# Patient Record
Sex: Male | Born: 1960 | Race: Asian | Hispanic: No | Marital: Married
Health system: Southern US, Community
[De-identification: ages and names within clinical notes are randomized; demographics above are authoritative.]

---

## 2015-03-22 ENCOUNTER — Other Ambulatory Visit: Payer: Self-pay | Admitting: Family Medicine

## 2015-03-22 ENCOUNTER — Ambulatory Visit
Admission: RE | Admit: 2015-03-22 | Discharge: 2015-03-22 | Disposition: A | Discharge status: Home / Self Care | Payer: BC Managed Care – PPO | Source: Ambulatory Visit | Attending: Family Medicine | Admitting: Family Medicine

## 2015-03-22 DIAGNOSIS — R05 Cough: Secondary | ICD-10-CM

## 2015-03-22 DIAGNOSIS — R059 Cough, unspecified: Secondary | ICD-10-CM

## 2016-05-30 IMAGING — CR DG CHEST 2V
2 series · 2 of 2 positions shown · non-contrast
Comparison: None.

CLINICAL DATA: Cough with persistent burning sensation in lungs

EXAM:
CHEST  2 VIEW

[w chest pa]
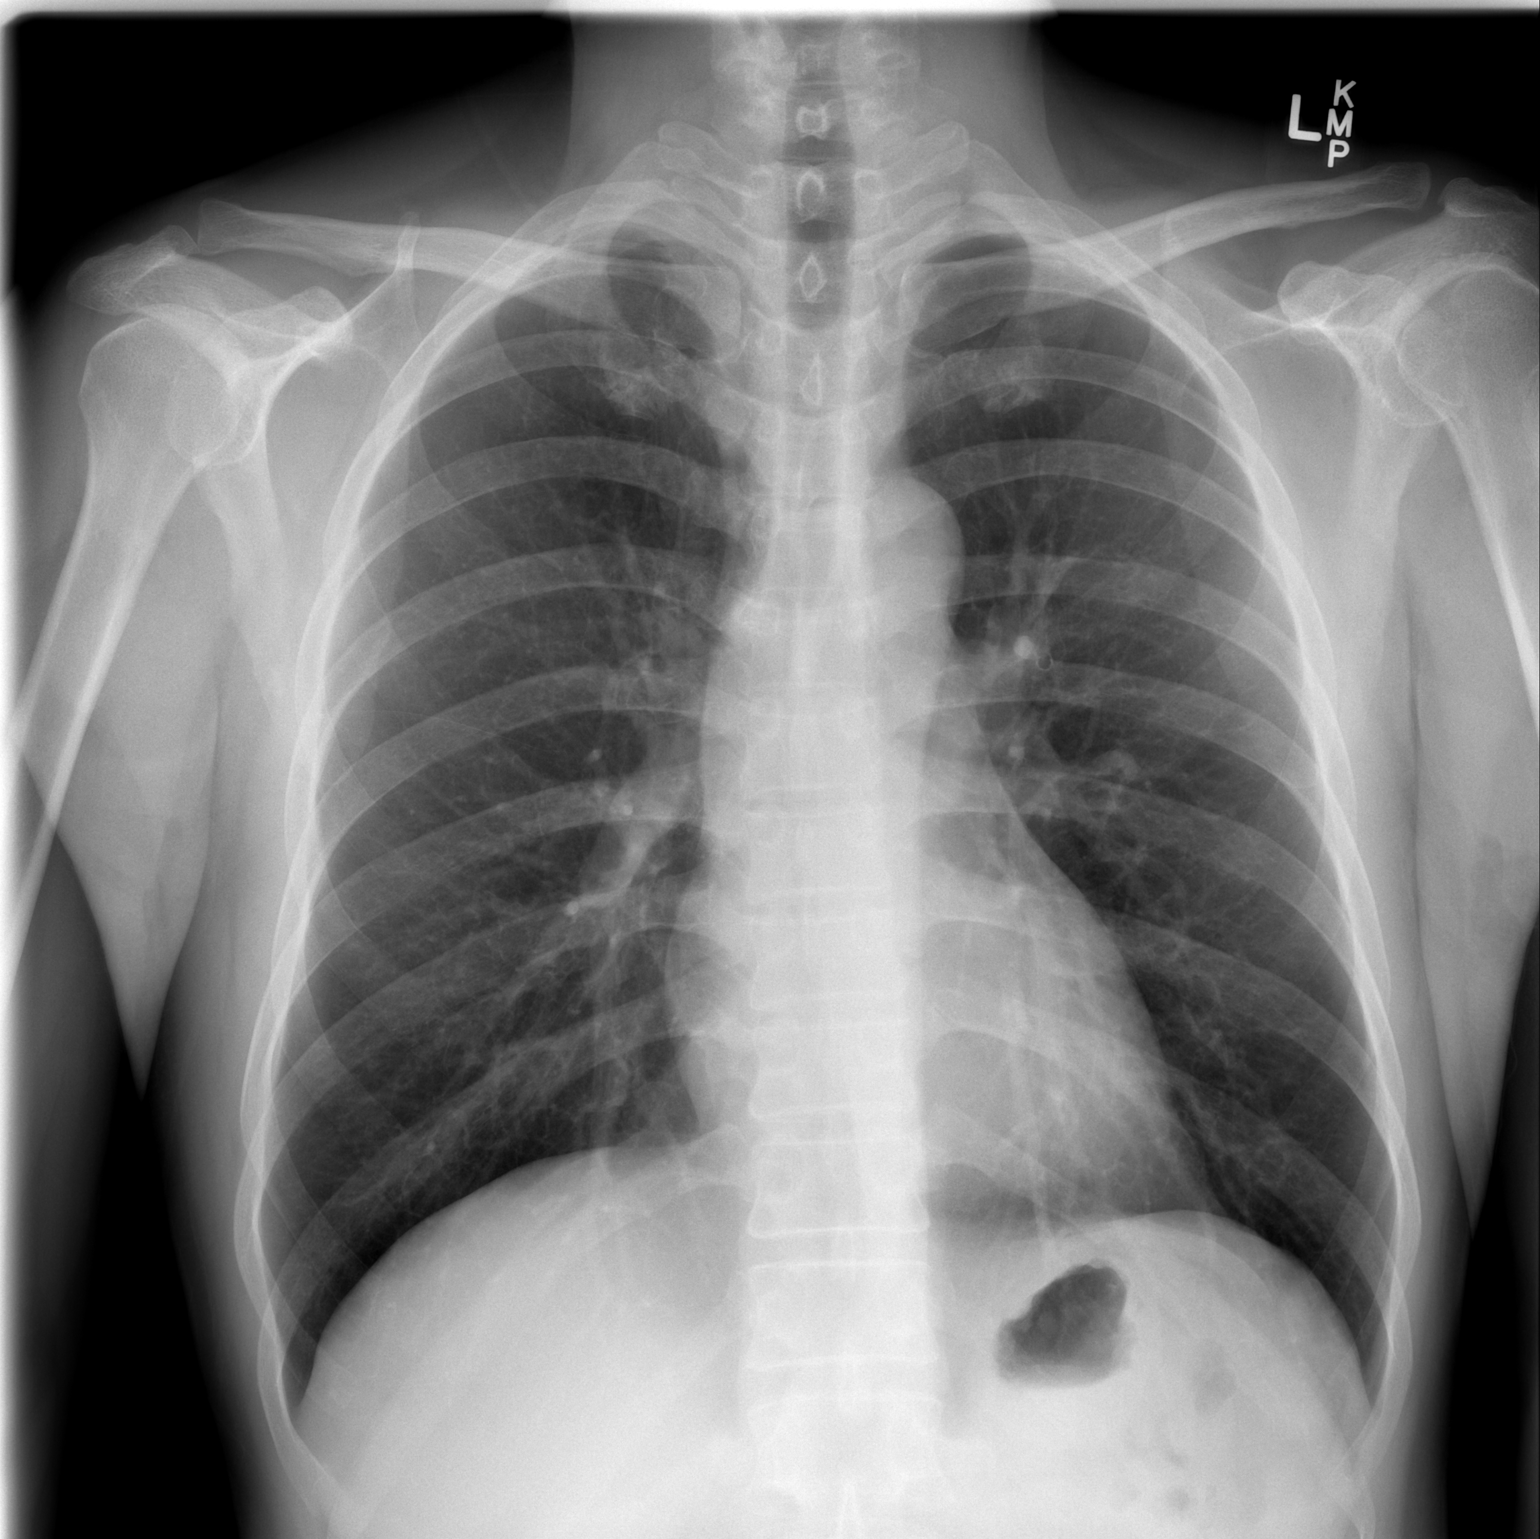

[w chest lat]
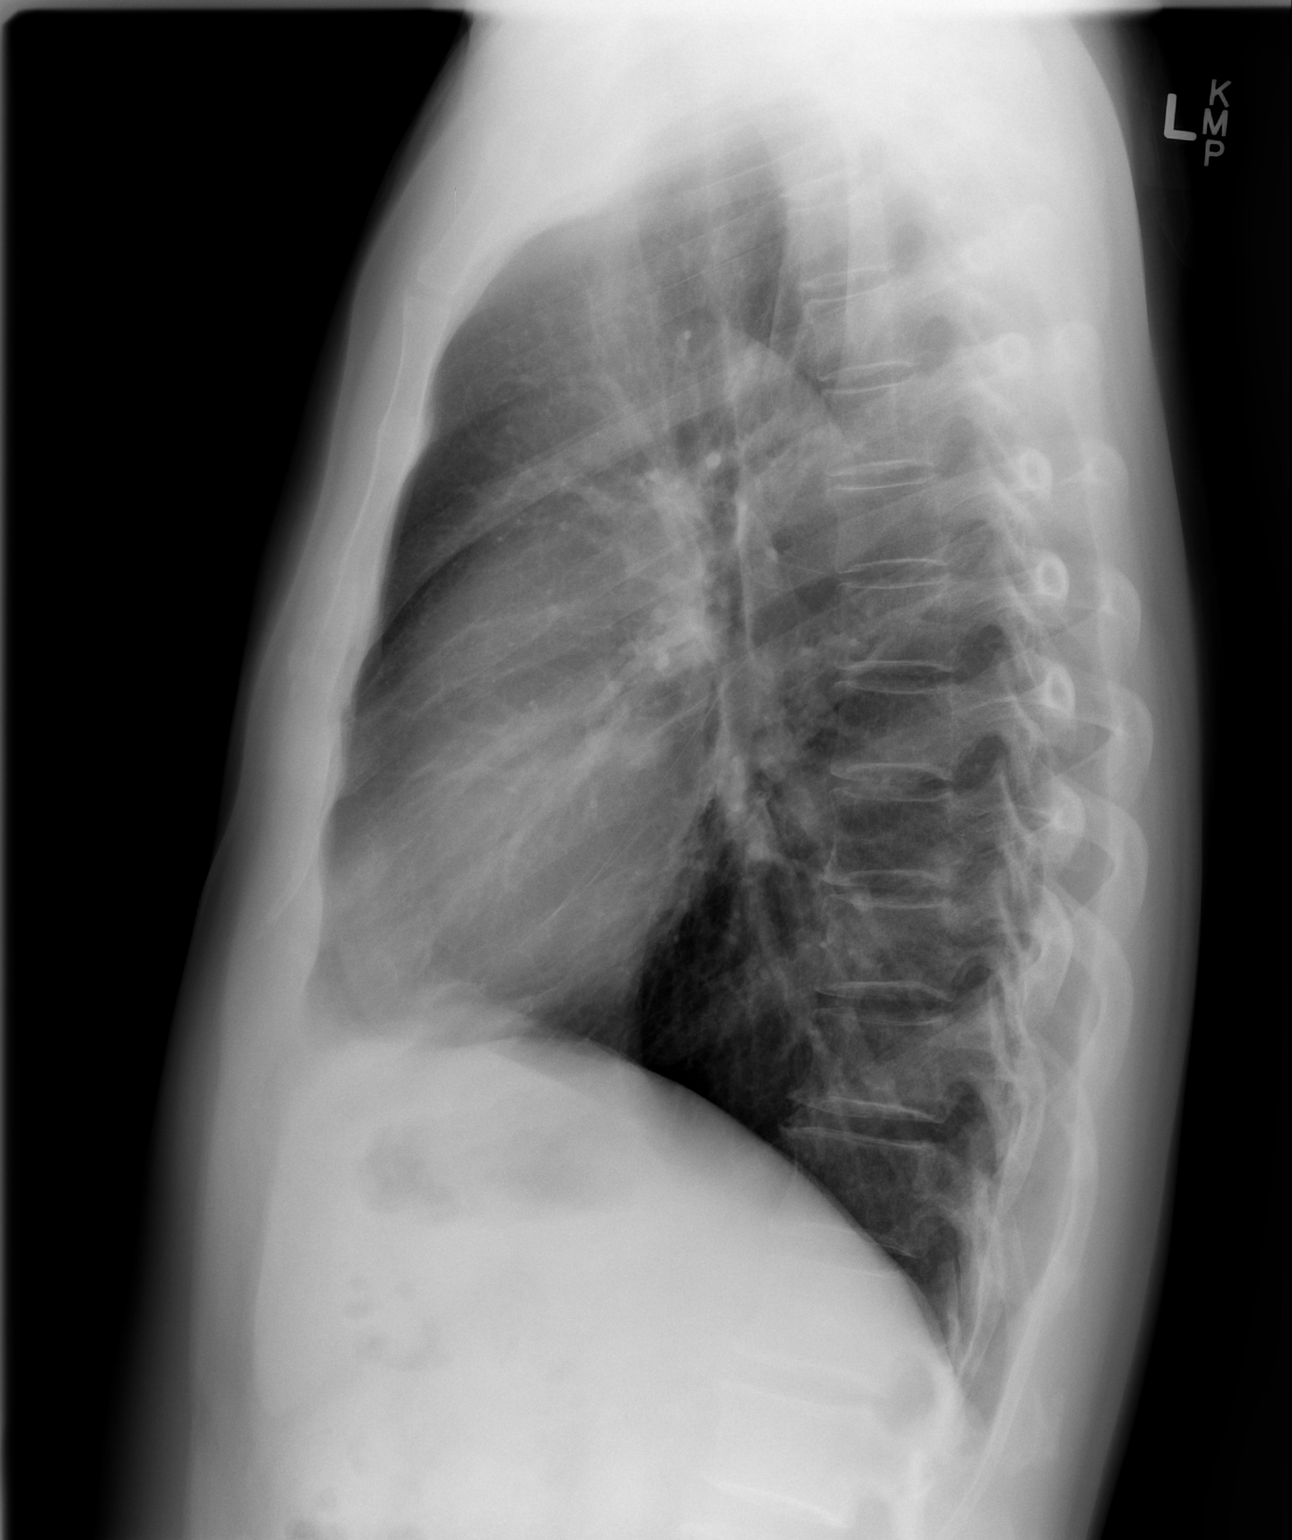

[2 of 2 positions shown; findings below may reference images not displayed]

FINDINGS: Lungs are clear. Heart size and pulmonary vascularity are normal. No
adenopathy. No bone lesions.
IMPRESSION: No abnormality noted.

## 2020-01-19 ENCOUNTER — Ambulatory Visit: Payer: BC Managed Care – PPO | Attending: Family

## 2020-01-19 DIAGNOSIS — Z23 Encounter for immunization: Secondary | ICD-10-CM

## 2020-01-19 NOTE — Progress Notes (Signed)
   Covid-19 Vaccination Clinic  Name:  Shawn Nash    MRN: [id number] DOB: 09-04-1961  01/19/2020  [name] was observed post Covid-19 immunization for 15 minutes without incident. He was provided with Vaccine Information Sheet and instruction to access the V-Safe system.   [name] was instructed to call 911 with any severe reactions post vaccine: Marland Kitchen Difficulty breathing  . Swelling of face and throat  . A fast heartbeat  . A bad rash all over body  . Dizziness and weakness   Immunizations Administered    Name Date Dose VIS Date Route   Moderna COVID-19 Vaccine 01/19/2020 10:28 AM 0.5 mL 10/11/2019 Intramuscular   Manufacturer: Moderna   Lot: [id number]   NDC: 16606-301-60

## 2020-02-21 ENCOUNTER — Ambulatory Visit: Payer: BC Managed Care – PPO | Attending: Family

## 2020-02-21 DIAGNOSIS — Z23 Encounter for immunization: Secondary | ICD-10-CM

## 2020-02-21 NOTE — Progress Notes (Signed)
   Covid-19 Vaccination Clinic  Name:  Shawn Nash    MRN: [id number] DOB: 02-07-1961  02/21/2020  Mr. [name] was observed post Covid-19 immunization for 15 minutes without incident. He was provided with Vaccine Information Sheet and instruction to access the V-Safe system.   Mr. [name] was instructed to call 911 with any severe reactions post vaccine: Marland Kitchen Difficulty breathing  . Swelling of face and throat  . A fast heartbeat  . A bad rash all over body  . Dizziness and weakness   Immunizations Administered    Name Date Dose VIS Date Route   Moderna COVID-19 Vaccine 02/21/2020 10:06 AM 0.5 mL 10/11/2019 Intramuscular   Manufacturer: Moderna   Lot: [id number]   NDC: 25956-387-56

## 2020-09-12 ENCOUNTER — Ambulatory Visit: Payer: BC Managed Care – PPO | Attending: Family

## 2020-09-12 DIAGNOSIS — Z23 Encounter for immunization: Secondary | ICD-10-CM

## 2020-11-25 NOTE — Progress Notes (Signed)
   Covid-19 Vaccination Clinic  Name:  Shawn Nash    MRN: [id number] DOB: April 22, 1961  11/25/2020  [name] was observed post Covid-19 immunization for 15 minutes without incident. He was provided with Vaccine Information Sheet and instruction to access the V-Safe system.   [name] was instructed to call 911 with any severe reactions post vaccine: Marland Kitchen Difficulty breathing  . Swelling of face and throat  . A fast heartbeat  . A bad rash all over body  . Dizziness and weakness   Immunizations Administered    Name Date Dose VIS Date Route   Moderna Covid-19 Booster Vaccine 09/12/2020  4:40 PM 0.25 mL 08/29/2020 Intramuscular   Manufacturer: Moderna   Lot: [id number]   NDC: 35361-443-15

## 2021-03-04 ENCOUNTER — Ambulatory Visit: Payer: BC Managed Care – PPO | Attending: Family

## 2021-03-04 DIAGNOSIS — Z23 Encounter for immunization: Secondary | ICD-10-CM

## 2021-04-06 NOTE — Progress Notes (Signed)
   Covid-19 Vaccination Clinic  Name:  Shawn Nash    MRN: [id number] DOB: [birth year]  04/06/2021  [name] was observed post Covid-19 immunization for 15 minutes without incident. He was provided with Vaccine Information Sheet and instruction to access the V-Safe system.   [name] was instructed to call 911 with any severe reactions post vaccine: Marland Kitchen Difficulty breathing  . Swelling of face and throat  . A fast heartbeat  . A bad rash all over body  . Dizziness and weakness   Immunizations Administered    Name Date Dose VIS Date Route   Moderna Covid-19 Booster Vaccine 03/04/2021  9:30 AM 0.25 mL 08/29/2020 Intramuscular   Manufacturer: Moderna   Lot: [id number]   NDC: 02111-735-67

## 2021-08-05 ENCOUNTER — Ambulatory Visit: Payer: Self-pay | Attending: Family

## 2021-08-05 DIAGNOSIS — Z23 Encounter for immunization: Secondary | ICD-10-CM

## 2021-08-05 NOTE — Progress Notes (Signed)
   Covid-19 Vaccination Clinic  Name:  Shawn Nash    MRN: [id number] DOB: 20-Mar-1961  08/05/2021  Mr. Shawn Nash was observed post Covid-19 immunization for 15 minutes without incident. He was provided with Vaccine Information Sheet and instruction to access the V-Safe system.   Mr. Shawn Nash was instructed to call 911 with any severe reactions post vaccine: Difficulty breathing  Swelling of face and throat  A fast heartbeat  A bad rash all over body  Dizziness and weakness
